# Patient Record
Sex: Male | Born: 1970 | Race: Asian | Hispanic: No | Marital: Married | Smoking: Current every day smoker
Health system: Southern US, Community
[De-identification: ages and names within clinical notes are randomized; demographics above are authoritative.]

---

## 2005-03-07 ENCOUNTER — Emergency Department (HOSPITAL_COMMUNITY): Admission: EM | Admit: 2005-03-07 | Discharge: 2005-03-07 | Payer: Self-pay | Admitting: Emergency Medicine
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
## 2014-01-29 ENCOUNTER — Other Ambulatory Visit: Payer: Self-pay | Admitting: Orthopaedic Surgery

## 2014-01-29 DIAGNOSIS — M545 Low back pain, unspecified: Secondary | ICD-10-CM

## 2014-02-05 ENCOUNTER — Ambulatory Visit
Admission: RE | Admit: 2014-02-05 | Discharge: 2014-02-05 | Disposition: A | Discharge status: Home / Self Care | Payer: Medicaid Other | Source: Ambulatory Visit | Attending: Orthopaedic Surgery | Admitting: Orthopaedic Surgery

## 2014-02-05 DIAGNOSIS — M545 Low back pain, unspecified: Secondary | ICD-10-CM

## 2024-03-04 ENCOUNTER — Ambulatory Visit: Admission: EM | Admit: 2024-03-04 | Discharge: 2024-03-04 | Disposition: A | Discharge status: Home / Self Care

## 2024-03-04 ENCOUNTER — Encounter (HOSPITAL_COMMUNITY): Payer: Self-pay | Admitting: *Deleted

## 2024-03-04 ENCOUNTER — Other Ambulatory Visit: Payer: Self-pay

## 2024-03-04 ENCOUNTER — Emergency Department (HOSPITAL_COMMUNITY)
Admission: EM | Admit: 2024-03-04 | Discharge: 2024-03-04 | Disposition: A | Discharge status: Home / Self Care | Source: Other Acute Inpatient Hospital | Attending: Emergency Medicine | Admitting: Emergency Medicine

## 2024-03-04 ENCOUNTER — Emergency Department (HOSPITAL_COMMUNITY)

## 2024-03-04 ENCOUNTER — Encounter: Payer: Self-pay | Admitting: *Deleted

## 2024-03-04 DIAGNOSIS — R221 Localized swelling, mass and lump, neck: Secondary | ICD-10-CM | POA: Diagnosis not present

## 2024-03-04 LAB — CBC WITH DIFFERENTIAL/PLATELET
Abs Immature Granulocytes: 0.02 K/uL (ref 0.00–0.07)
Basophils Absolute: 0 K/uL (ref 0.0–0.1)
Basophils Relative: 0 %
Eosinophils Absolute: 0.1 K/uL (ref 0.0–0.5)
Eosinophils Relative: 2 %
HCT: 42.4 % (ref 39.0–52.0)
Hemoglobin: 13.1 g/dL (ref 13.0–17.0)
Immature Granulocytes: 0 %
Lymphocytes Relative: 36 %
Lymphs Abs: 2.1 K/uL (ref 0.7–4.0)
MCH: 24.7 pg — ABNORMAL LOW (ref 26.0–34.0)
MCHC: 30.9 g/dL (ref 30.0–36.0)
MCV: 79.8 fL — ABNORMAL LOW (ref 80.0–100.0)
Monocytes Absolute: 0.3 K/uL (ref 0.1–1.0)
Monocytes Relative: 5 %
Neutro Abs: 3.3 K/uL (ref 1.7–7.7)
Neutrophils Relative %: 57 %
Platelets: 376 K/uL (ref 150–400)
RBC: 5.31 MIL/uL (ref 4.22–5.81)
RDW: 13.7 % (ref 11.5–15.5)
WBC: 5.9 K/uL (ref 4.0–10.5)
nRBC: 0 % (ref 0.0–0.2)

## 2024-03-04 LAB — I-STAT CHEM 8, ED
BUN: 13 mg/dL (ref 6–20)
Calcium, Ion: 1.1 mmol/L — ABNORMAL LOW (ref 1.15–1.40)
Chloride: 105 mmol/L (ref 98–111)
Creatinine, Ser: 0.7 mg/dL (ref 0.61–1.24)
Glucose, Bld: 91 mg/dL (ref 70–99)
HCT: 41 % (ref 39.0–52.0)
Hemoglobin: 13.9 g/dL (ref 13.0–17.0)
Potassium: 4.2 mmol/L (ref 3.5–5.1)
Sodium: 139 mmol/L (ref 135–145)
TCO2: 26 mmol/L (ref 22–32)

## 2024-03-04 LAB — BASIC METABOLIC PANEL WITH GFR
Anion gap: 10 (ref 5–15)
BUN: 11 mg/dL (ref 6–20)
CO2: 23 mmol/L (ref 22–32)
Calcium: 9.1 mg/dL (ref 8.9–10.3)
Chloride: 103 mmol/L (ref 98–111)
Creatinine, Ser: 0.62 mg/dL (ref 0.61–1.24)
GFR, Estimated: >60 mL/min (ref >60)
Glucose, Bld: 90 mg/dL (ref 70–99)
Potassium: 4.1 mmol/L (ref 3.5–5.1)
Sodium: 136 mmol/L (ref 135–145)

## 2024-03-04 MED ORDER — IOHEXOL 350 MG/ML SOLN
75.0000 mL | Freq: Once | INTRAVENOUS | Status: AC | PRN
  Administered 2024-03-04: 75 mL via INTRAVENOUS

## 2024-03-04 MED ORDER — AMOXICILLIN-POT CLAVULANATE 875-125 MG PO TABS
1.0000 | ORAL_TABLET | Freq: Once | ORAL | Status: AC
  Administered 2024-03-04: 1 via ORAL
  Filled 2024-03-04: qty 1

## 2024-03-04 MED ORDER — AMOXICILLIN-POT CLAVULANATE 875-125 MG PO TABS: 1.0000 | ORAL_TABLET | Freq: Two times a day (BID) | ORAL | 0 refills | Status: AC

## 2024-03-04 NOTE — Discharge Instructions (Signed)
 Please go to the emergency department for assessment of your neck mass.

## 2024-03-04 NOTE — ED Triage Notes (Signed)
 Pt reports he has had a knot on right side of his neck x 2 weeks. It started after a cough but that his resolved. Denies fever. States tender to touch.

## 2024-03-04 NOTE — ED Notes (Signed)
 Patient is being discharged from the Urgent Care and sent to the Emergency Department via personal vehicle. Per Asberry Searle, PA-C, patient is in need of higher level of care due to needing a higher level care nto available at Urgent Care. Patient is aware and verbalizes understanding of plan of care.  Vitals:   03/04/24 0822  BP: 130/87  Pulse: 71  Resp: 20  Temp: 98.3 F (36.8 C)  SpO2: 98%

## 2024-03-04 NOTE — Discharge Instructions (Signed)
 As discussed, you will need to have follow-up with the ENT provider (Dr. Roark) early next week.  Please call their office first thing Monday morning and tell them that you were referred from the [facility] emergency department.  Seek emergency care if experiencing any new or worsening symptoms such as difficulty breathing or swallowing.

## 2024-03-04 NOTE — ED Provider Notes (Signed)
 [place] EMERGENCY DEPARTMENT AT [facility] Provider Note   CSN: [id number] Arrival date & time: 03/04/24  9077     Patient presents with: Mass   Gregory Schmitt is a 53 y.o. male with non-contributory PMHx who presents to ED concerned for a mass in right upper neck -  just underneath jaw line. Patient endorsing having a viral URI last week with coughing, and neck mass started growing in size since then. Family member finally convinced patient to seek medical care today and they went to UC who referred patient to ED for further imaging.    HPI     Prior to Admission medications   Medication Sig Start Date End Date Taking? Authorizing Provider  amoxicillin -clavulanate (AUGMENTIN ) 875-125 MG tablet Take 1 tablet by mouth every 12 (twelve) hours for 7 days. 03/04/24 03/11/24 Yes Hoy Nidia FALCON, PA-C    Allergies: Patient has no known allergies.    Review of Systems  HENT:         Neck mass    Updated Vital Signs BP 134/83 (BP Location: Right Arm)   Pulse 66   Temp 98.4 F (36.9 C)   Resp 17   SpO2 100%   Physical Exam Vitals and nursing note reviewed.  Constitutional:      General: He is not in acute distress.    Appearance: He is not ill-appearing or toxic-appearing.  HENT:     Head: Normocephalic and atraumatic.     Mouth/Throat:     Comments: Hardened mass on right upper neck just below angle of mandible. Not pulsatile. Bedside US  without concern for abscess or pulsatile mass. Eyes:     General: No scleral icterus.       Right eye: No discharge.        Left eye: No discharge.     Conjunctiva/sclera: Conjunctivae normal.  Cardiovascular:     Rate and Rhythm: Normal rate.  Pulmonary:     Effort: Pulmonary effort is normal.  Abdominal:     General: Abdomen is flat.  Skin:    General: Skin is warm and dry.  Neurological:     General: No focal deficit present.     Mental Status: He is alert. Mental status is at baseline.  Psychiatric:         Mood and Affect: Mood normal.        Behavior: Behavior normal.     (all labs ordered are listed, but only abnormal results are displayed) Labs Reviewed  CBC WITH DIFFERENTIAL/PLATELET - Abnormal; Notable for the following components:      Result Value   MCV 79.8 (*)    MCH 24.7 (*)    All other components within normal limits  I-STAT CHEM 8, ED - Abnormal; Notable for the following components:   Calcium, Ion 1.10 (*)    All other components within normal limits  BASIC METABOLIC PANEL WITH GFR    EKG: None  Radiology: CT Soft Tissue Neck W Contrast Result Date: 03/04/2024 EXAM: CT NECK WITH CONTRAST 03/04/2024 12:07:01 PM TECHNIQUE: CT of the neck was performed with the administration of intravenous contrast. Multiplanar reformatted images are provided for review. Automated exposure control, iterative reconstruction, and/or weight based adjustment of the mA/kV was utilized to reduce the radiation dose to as low as reasonably achievable. COMPARISON: None available. CLINICAL HISTORY: Soft tissue infection suspected, neck, xray done. Patient c/o swelling to the right side of his neck onset 2 weeks ago after coughing. Denies SOB or  any difficulty swallowing, denies SOB. States is tender to touch. FINDINGS: AERODIGESTIVE TRACT: There is abnormal enhancement and fusiform thickening of the right true vocal cord, which is demonstrated on image 92 of series 3 and also image 70 of coronal series 6. The abnormal thickening and enhancement is fine to the regulitis. SALIVARY GLANDS: The parotid and submandibular glands are unremarkable. THYROID: Unremarkable. LYMPH NODES: There are irregular, centrally hypodense right level 2A and 3 lymph nodes seen on images 69 and 72 of series 3 with the larger node measuring 2.2 x 1.7 x 1.6 cm. The level 3 node measures approximately 11 x 11 x 16 mm. Several additional smaller right cervical lymph nodes are present. SOFT TISSUES: No mass or fluid collection. BRAIN,  ORBITS, SINUSES AND MASTOIDS: There is mucosal disease present within the frontal, ethmoid and maxillary sinuses. LUNGS AND MEDIASTINUM: No acute abnormality. BONES: No focal bone abnormality. IMPRESSION: 1. Abnormal enhancement and fusiform thickening of the right true vocal cord. Referral to ENT is recommended. 2. Irregular, centrally hypodense right level 2A and 3 lymph nodes, with the largest measuring 2.2 x 1.7 x 1.6 cm. The lymph nodes could be suppurative or centrally necrotic. Metastatic disease is a diagnosis of exclusion. 3. Mucosal disease within the frontal, ethmoid, and maxillary sinuses. Electronically signed by: Evalene Coho MD 03/04/2024 12:28 PM EDT RP Workstation: [id number]     Procedures   Medications Ordered in the ED  amoxicillin -clavulanate (AUGMENTIN ) 875-125 MG per tablet 1 tablet (has no administration in time range)  iohexol  (OMNIPAQUE ) 350 MG/ML injection 75 mL (75 mLs Intravenous Contrast Given 03/04/24 1157)                                    Medical Decision Making Amount and/or Complexity of Data Reviewed Labs: ordered. Radiology: ordered.  Risk Prescription drug management.   This patient presents to the ED for concern of neck mass, this involves an extensive number of treatment options, and is a complaint that carries with it a high risk of complications and morbidity.  The differential diagnosis includes cancer, lymph node, deep space infection, etc.   Co morbidities that complicate the patient evaluation  none   Additional history obtained:  No PCP listed in chart    Problem List / ED Course / Critical interventions / Medication management  Patient presents to ED concern for mass on right side of neck that has been developing over the past 1 to 2 weeks.  Patient believes that mass started because he was coughing last week.  Physical exam with hardened mass just below the angle of the right mandible.  Rest of physical exam reassuring.   Patient afebrile with stable vitals. I Ordered, and personally interpreted labs.  CBC without leukocytosis or anemia.  BMP reassuring. I ordered imaging studies including CT soft tissue neck. I independently visualized and interpreted imaging which showed abnormal enhancement in falciform thickening of the right true vocal cord.  Irregular hypodense lymph nodes concerning for suppurative/necrotic/metastatic disease. I agree with the radiologist interpretation I requested consultation with the ENT provider on-call Dr. Roark,  and discussed lab and imaging findings as well as pertinent plan - they recommend: quick outpatient follow up with their office early next week. Also recommending starting Augmentin  for possible infectious cause.  Shared all results with patient and family member at bedside.  Answered all questions.  Patient agrees to call the ENT office first  thing on Monday morning for a quick follow-up appointment.  Wife at bedside concerned because patient will have to miss work.  I have given patient work excuse. Patient tolerated first dose of Augmentin  well in ED - sent rest of course to pharmacy. Patient still tolerating food and breathing well. I have reviewed the patients home medicines and have made adjustments as needed The patient has been appropriately medically screened and/or stabilized in the ED. I have low suspicion for any other emergent medical condition which would require further screening, evaluation or treatment in the ED or require inpatient management. At time of discharge the patient is hemodynamically stable and in no acute distress. I have discussed work-up results and diagnosis with patient and answered all questions. Patient is agreeable with discharge plan. We discussed strict return precautions for returning to the emergency department and they verbalized understanding.     Social Determinants of Health:  none      Final diagnoses:  Neck mass    ED Discharge  Orders          Ordered    amoxicillin -clavulanate (AUGMENTIN ) 875-125 MG tablet  Every 12 hours        03/04/24 1410               Hoy Nidia FALCON, PA-C 03/04/24 1413    Patsey Lot, MD 03/08/24 [phone]

## 2024-03-04 NOTE — ED Provider Notes (Signed)
 [facility]    CSN: [id number] Arrival date & time: 03/04/24  0803      History   Chief Complaint Chief Complaint  Patient presents with   Mass    HPI Gregory Schmitt is a 53 y.o. male.  2 weeks ago he had a bad cough Reports he was coughing so hard a bulge appeared on the right neck It's now been worsening the last several days or so Tight feeling when he moves the neck Having headaches on and off.  Maybe some discomfort swallowing but he can tolerate fluids and secretions Denies fever, chills, abd pain, NVD, night sweats, weight loss Not short of breath, no chest pain or palpitations   History reviewed. No pertinent past medical history.  There are no active problems to display for this patient.   History reviewed. No pertinent surgical history.     Home Medications    Prior to Admission medications   Not on File    Family History History reviewed. No pertinent family history.  Social History Social History   Tobacco Use   Smoking status: Every Day    Types: Cigarettes   Smokeless tobacco: Never  Vaping Use   Vaping status: Former  Substance Use Topics   Alcohol use: Not Currently     Allergies   Patient has no known allergies.   Review of Systems Review of Systems  As per HPI  Physical Exam Triage Vital Signs ED Triage Vitals  Encounter Vitals Group     BP 03/04/24 0822 130/87     Girls Systolic BP Percentile --      Girls Diastolic BP Percentile --      Boys Systolic BP Percentile --      Boys Diastolic BP Percentile --      Pulse Rate 03/04/24 0822 71     Resp 03/04/24 0822 20     Temp 03/04/24 0822 98.3 F (36.8 C)     Temp Source 03/04/24 0822 Oral     SpO2 03/04/24 0822 98 %     Weight --      Height --      Head Circumference --      Peak Flow --      Pain Score 03/04/24 0829 0     Pain Loc --      Pain Education --      Exclude from Growth Chart --    No data found.  Updated Vital Signs BP  130/87 (BP Location: Left Arm)   Pulse 71   Temp 98.3 F (36.8 C) (Oral)   Resp 20   SpO2 98%    Physical Exam Vitals and nursing note reviewed.  Constitutional:      General: He is not in acute distress.    Appearance: Normal appearance.  HENT:     Head: Mass present.     Mouth/Throat:     Pharynx: Oropharynx is clear.  Neck:     Thyroid: No thyroid mass.     Vascular: Normal carotid pulses. No carotid bruit or JVD.     Trachea: Trachea normal.      Comments: Firm non mobile mass of the right neck. Tender to palpation  Cardiovascular:     Rate and Rhythm: Normal rate and regular rhythm.     Pulses: Normal pulses.          Carotid pulses are 2+ on the right side and 2+ on the left side.    Heart sounds:  Normal heart sounds.  Pulmonary:     Effort: Pulmonary effort is normal.     Breath sounds: Normal breath sounds.  Musculoskeletal:     Cervical back: No spinous process tenderness. Normal range of motion.  Neurological:     Mental Status: He is alert and oriented to person, place, and time.     UC Treatments / Results  Labs (all labs ordered are listed, but only abnormal results are displayed) Labs Reviewed - No data to display  EKG  Radiology No results found.  Procedures Procedures   Medications Ordered in UC Medications - No data to display  Initial Impression / Assessment and Plan / UC Course  I have reviewed the triage vital signs and the nursing notes.  Pertinent labs & imaging results that were available during my care of the patient were reviewed by me and considered in my medical decision making (see chart for details).  Firm mass of the right neck just inferior to angle of jaw. The SCM feels rigid in this area. Tender to palpation.  Does not seem to be lymph node.  Discussion with patient.  At this time I recommend patient have advanced imaging performed to rule out deep neck infection, non-benign mass, or carotid abnormality. He is sent to the  emergency department for imaging not available in urgent care setting.   Final Clinical Impressions(s) / UC Diagnoses   Final diagnoses:  Neck mass     Discharge Instructions      Please go to the emergency department for assessment of your neck mass.    ED Prescriptions   None    PDMP not reviewed this encounter.   Jeryl Stabs, PA-C 03/04/24 0925

## 2024-03-04 NOTE — ED Notes (Signed)
 Pt has been missing from bed for a little bit.  Unclear if they have left.  Message left on phone

## 2024-03-04 NOTE — ED Triage Notes (Signed)
 Patient c/o swelling to the right side of his neck onset 2 weeks ago after coughing . Denies sob or any difficulty swallowing, denies sob. States is tender to touch.

## 2024-03-07 ENCOUNTER — Encounter (INDEPENDENT_AMBULATORY_CARE_PROVIDER_SITE_OTHER): Payer: Self-pay | Admitting: Otolaryngology

## 2024-03-07 ENCOUNTER — Ambulatory Visit (INDEPENDENT_AMBULATORY_CARE_PROVIDER_SITE_OTHER): Admitting: Otolaryngology

## 2024-03-07 VITALS — BP 116/77 | HR 83 | Ht 67.0 in | Wt 130.0 lb

## 2024-03-07 DIAGNOSIS — F17200 Nicotine dependence, unspecified, uncomplicated: Secondary | ICD-10-CM

## 2024-03-07 DIAGNOSIS — F17210 Nicotine dependence, cigarettes, uncomplicated: Secondary | ICD-10-CM

## 2024-03-07 DIAGNOSIS — R221 Localized swelling, mass and lump, neck: Secondary | ICD-10-CM

## 2024-03-07 NOTE — Patient Instructions (Addendum)
 I have ordered a biopsy for you to complete prior to your next visit. Please call [facility] Scheduling at [phone] to schedule your imaging if you have not received a call within 24 hours. If you are unable to complete your imaging study prior to your next scheduled visit please call our office to let us  know.

## 2024-03-07 NOTE — Progress Notes (Signed)
 Otolaryngology Clinic Note HPI:  Gregory Schmitt is a 53 y.o. male kindly referred for ED follow up for neck mass  Initial Visit (02/2024): Noted right neck mass after he started coughing about 2 weeks ago. He was given augmentin , feels like it may have gone down slightly. He can still feel it some. Some pain with neck turning but mild, some right ear tingling. No fevers. Does have a sore throat, but only going on for 2 weeks Patient otherwise denies: - dysphagia, unintentional weight loss - changes in voice, shortness of breath, hemoptysis  ENT Surgery: no Personal or FHx of bleeding dz or anesthesia difficulty: no  GLP-1: no AP/AC: no  Tobacco: 0.33 packs/year (long time). Alcohol: 12 packs/weekend. Occupation: Fish farm manager  PMHx: Denies  Independent Review of Additional Tests or Records:  Nidia Mays (03/04/2024): noted right neck mass, in setting of recent viral URI; CT was done, which showed LN concerning for metastatic LN; Dx: Neck mass, likely vocal fold mass; Rx: ref to ENT, given Augmentin  CT Neck 03/04/2024 independently interpreted: multiple right neck lymph nodes with likely some internal necrosis; right VF appears to enhance more than left. Parotids b/l mildly enlarged but no nodules; modest MPT frontal/ethmoid/max; do not note obvious tongue base or tonsillar masses PMH/Meds/All/SocHx/FamHx/ROS:  History reviewed. No pertinent past medical history.   History reviewed. No pertinent surgical history.  History reviewed. No pertinent family history.   Social Connections: Not on file      Current Outpatient Medications:    amoxicillin -clavulanate (AUGMENTIN ) 875-125 MG tablet, Take 1 tablet by mouth every 12 (twelve) hours for 7 days., Disp: 14 tablet, Rfl: 0   Physical Exam:   BP 116/77 (BP Location: Left Arm, Patient Position: Sitting, Cuff Size: Normal)   Pulse 83   Ht 5' 7 (1.702 m)   Wt 130 lb (59 kg)   SpO2 98%   BMI 20.36 kg/m   Salient findings:  CN  II-XII intact  Bilateral EAC clear and TM intact with well pneumatized middle ear spaces Anterior rhinoscopy: Septum intact; bilateral inferior turbinates without significant hypertrophy No lesions of oral cavity/oropharynx; dentition fair; tonsils soft, no palpable GT sulcus masses Firm right neck level 1b/2 mass, palpable; no other palpable LAD No respiratory distress or stridor; TFL was indicated to better evaluate the proximal airway, given the patient's history and exam findings, and is detailed below.   Seprately Identifiable Procedures:  Prior to initiating any procedures, risks/benefits/alternatives were explained to the patient and verbal consent obtained. Procedure Note Pre-procedure diagnosis:  Neck mass, investigate primary lesion Post-procedure diagnosis: Same Procedure: Transnasal Fiberoptic Laryngoscopy, CPT 31575 - Mod 25 Indication: see above Complications: None apparent EBL: 0 mL  The procedure was undertaken to further evaluate the patient's complaint above, with mirror exam inadequate for appropriate examination due to gag reflex and poor patient tolerance  Procedure:  Patient was identified as correct patient. Verbal consent was obtained. The nose was sprayed with oxymetazoline and 4% lidocaine. The The flexible laryngoscope was passed through the nose to view the nasal cavity, pharynx (oropharynx, hypopharynx) and larynx.  The larynx was examined at rest and during multiple phonatory tasks. Documentation was obtained and reviewed with patient. The scope was removed. The patient tolerated the procedure well.  Findings: The nasal cavity and nasopharynx did not reveal any masses or lesions, mucosa appeared to be without obvious lesions. The tongue base, pharyngeal walls, piriform sinuses, vallecula, epiglottis and postcricoid region are normal in appearance - query slight asymmetry right pyriform and  left pyriform; modest retained secretions; no masses on either vocal folds  or tongue base. The visualized portion of the subglottis and proximal trachea is widely patent. The vocal folds are mobile bilaterally. There are no lesions on the free edge of the vocal folds nor elsewhere in the larynx worrisome for malignancy.    Electronically signed by: Eldora KATHEE Blanch, MD 03/07/2024 11:19 AM   Impression & Plans:  Gregory Schmitt is a 53 y.o. male with:  1. Neck mass   2. Tobacco use disorder    Right neck mass in setting of tobacco and alcohol use. DDX is wide but given history, malignancy highest on differential.  Will order STAT Bx of neck; depending on results, consider DL/Bx  Given the patient's tobacco use, I also discussed cessation and options for cessation, including counseling. Counseled patient on the dangers of tobacco use, advised patient to stop smoking, and reviewed strategies to maximize success. Total time spent with this was 4 minutes.    See below regarding exact medications prescribed this encounter including dosages and route: No orders of the defined types were placed in this encounter.     Thank you for allowing me the opportunity to care for your patient. Please do not hesitate to contact me should you have any other questions.  Sincerely, Eldora Blanch, MD Otolaryngologist (ENT), [facility] Phone: [phone] Fax: [phone]  03/07/2024, 11:18 AM   MDM:  Level 4 - [id number] Complexity/Problems addressed: mod - new problem, unknown diagnosis and prognosis needing further testing Data complexity: high - independent interpretation of CT; review of notes, labs, ordering test - Morbidity: low currently  - Prescription Drug prescribed or managed: no

## 2024-03-09 ENCOUNTER — Encounter (HOSPITAL_COMMUNITY): Payer: Self-pay

## 2024-03-09 NOTE — Progress Notes (Signed)
 wonda Sieving, MD  Kaylise Blakeley PROCEDURE / BIOPSY REVIEW Date: 03/08/24  Requested Biopsy site: R neck LAN Reason for request: r/o met Imaging review: Best seen on CT 9/6 3:65-76  Decision: Approved Imaging modality to perform: Ultrasound Schedule with: No sedation / Local anesthetic Schedule for: Any VIR  Additional comments:   Please contact me with questions, concerns, or if issue pertaining to this request arise.  Dayne Sieving Faes, MD [facility]       Previous Messages    ----- Message ----- From: Lilliauna Van Sent: 03/07/2024  11:19 AM EDT To: Nashalie Sallis; Ir Procedure Requests Subject: US  Core biopsy ( lymph nodes)                  Procedure : US  core biopsy ( lymph nodes)  Reason : right neck mass, concern for carcinoma; request core biopsy Dx: Neck mass [R22.1 (ICD-10-CM)]  Ordering Comments  right neck mass, concern for carcinoma; request core biopsy    History : Ct soft tissue neck  Provider : Tobie Eldora NOVAK, MD  Contact : [phone]

## 2024-03-21 ENCOUNTER — Ambulatory Visit (INDEPENDENT_AMBULATORY_CARE_PROVIDER_SITE_OTHER): Admitting: Otolaryngology

## 2024-03-31 ENCOUNTER — Ambulatory Visit (HOSPITAL_COMMUNITY)
Admission: RE | Admit: 2024-03-31 | Discharge: 2024-03-31 | Disposition: A | Discharge status: Home / Self Care | Source: Ambulatory Visit | Attending: Otolaryngology | Admitting: Otolaryngology

## 2024-03-31 ENCOUNTER — Other Ambulatory Visit: Payer: Self-pay

## 2024-03-31 DIAGNOSIS — R221 Localized swelling, mass and lump, neck: Secondary | ICD-10-CM | POA: Insufficient documentation

## 2024-03-31 DIAGNOSIS — R590 Localized enlarged lymph nodes: Secondary | ICD-10-CM | POA: Diagnosis present

## 2024-03-31 MED ORDER — LIDOCAINE HCL (PF) 1 % IJ SOLN: 10.0000 mL | Freq: Once | INTRAMUSCULAR | Status: DC

## 2024-03-31 MED ORDER — LIDOCAINE HCL 1 % IJ SOLN
INTRAMUSCULAR | Status: AC
  Filled 2024-03-31: qty 20

## 2024-03-31 NOTE — Procedures (Signed)
 Vascular [facility] Inter[facility] Radiology Procedure Note  Patient: Gregory Schmitt DOB: October 03, 1970 Medical Record Number: [id number] Note Date/Time: 03/31/24 1:01 PM   Performing Physician: Thom Hall, MD Assistant(s): None  Diagnosis: enlarged R cervical LNs. No DX   Procedure: RIGHT CERVICAL LYMPH NODE BIOPSY  Anesthesia: Conscious Sedation Complications: None Estimated Blood Loss: Minimal Specimens: Sent for Pathology  Findings:  Successful Ultrasound-guided biopsy of enlarged R cervical LNs. A total of 3 samples were obtained. Hemostasis of the tract was achieved using Manual Pressure.  Plan: Bed rest for 1 hours.  See detailed procedure note with images in PACS. The patient tolerated the procedure well without incident or complication and was returned to Recovery in stable condition.    Thom Hall, MD [facility]   Pager. [phone] Clinic. [phone]

## 2024-04-06 ENCOUNTER — Telehealth (INDEPENDENT_AMBULATORY_CARE_PROVIDER_SITE_OTHER): Payer: Self-pay

## 2024-04-06 ENCOUNTER — Telehealth (INDEPENDENT_AMBULATORY_CARE_PROVIDER_SITE_OTHER): Payer: Self-pay | Admitting: Otolaryngology

## 2024-04-06 LAB — SURGICAL PATHOLOGY

## 2024-04-06 NOTE — Telephone Encounter (Signed)
 LVM for patient to call to schedule an appt for next week.

## 2024-04-06 NOTE — Telephone Encounter (Signed)
-----   Message from Eldora KATHEE Blanch sent at 04/05/2024  3:28 PM EDT ----- Regarding: RE: Pathology update Thanks. Nef would you mind adding this patient on for next week? Thanks KP ----- Message ----- From: Hughes Simmonds, MD Sent: 04/05/2024   2:28 PM EDT To: Pepper Dutton, MD; Eldora KATHEE Blanch, MD Subject: RE: Pathology update                           Acknowledged. Added the Pt's ENT to this conversation.   Thanks, ONA, MD ----- Message ----- From: Dutton Pepper, MD Sent: 04/05/2024  11:37 AM EDT To: Simmonds Hughes, MD Subject: Pathology update                               Hello Dr. 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 There is squamous differentiation but p40 is only partial positive. 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 If we can find any squamous cell carcinoma elsewhere in the body that will support the diagnosis of squamous cell carcinoma. So imaging study is the the key. You will se the report soon.  Thanks,  Haiyan

## 2024-04-06 NOTE — Telephone Encounter (Signed)
 The patient's wife called back, she is unable to schedule the requested appointment until she speaks with her husband when he gets home from work.  I expressed that Dr Tobie is requesting that he come into the office next week.

## 2024-04-11 ENCOUNTER — Encounter (INDEPENDENT_AMBULATORY_CARE_PROVIDER_SITE_OTHER): Payer: Self-pay | Admitting: Otolaryngology

## 2024-04-11 ENCOUNTER — Ambulatory Visit (INDEPENDENT_AMBULATORY_CARE_PROVIDER_SITE_OTHER): Admitting: Otolaryngology

## 2024-04-11 VITALS — BP 125/76 | HR 73 | Ht 67.0 in | Wt 130.0 lb

## 2024-04-11 DIAGNOSIS — C7989 Secondary malignant neoplasm of other specified sites: Secondary | ICD-10-CM

## 2024-04-11 DIAGNOSIS — F17210 Nicotine dependence, cigarettes, uncomplicated: Secondary | ICD-10-CM | POA: Diagnosis not present

## 2024-04-11 DIAGNOSIS — C4492 Squamous cell carcinoma of skin, unspecified: Secondary | ICD-10-CM

## 2024-04-11 DIAGNOSIS — F17200 Nicotine dependence, unspecified, uncomplicated: Secondary | ICD-10-CM

## 2024-04-11 MED ORDER — CLINDAMYCIN HCL 300 MG PO CAPS: 300.0000 mg | ORAL_CAPSULE | Freq: Three times a day (TID) | ORAL | 0 refills | Status: AC

## 2024-04-11 NOTE — Progress Notes (Signed)
 Otolaryngology Clinic Note HPI:  Gregory Schmitt is a 53 y.o. male kindly referred for ED follow up for neck mass  Initial Visit (02/2024): Noted right neck mass after he started coughing about 2 weeks ago. He was given augmentin , feels like it may have gone down slightly. He can still feel it some. Some pain with neck turning but mild, some right ear tingling. No fevers. Does have a sore throat, but only going on for 2 weeks Patient otherwise denies: - dysphagia, unintentional weight loss - changes in voice, shortness of breath, hemoptysis  --------------------------------------------------------- 04/11/2024 Some delay due to his schedule, as he could not have the biopsies done for a month. We discussed his path. He reports he has some tenderness in his neck and redness afterwards making it harder to move his neck. No PET yet.  He reports he quit drinking but still continues to smoke.   ENT Surgery: no Personal or FHx of bleeding dz or anesthesia difficulty: no  GLP-1: no AP/AC: no  Tobacco: 0.33 packs/year (long time). Alcohol: 12 packs/weekend. Occupation: Fish farm manager  PMHx: Denies  Independent Review of Additional Tests or Records:  Nidia Mays (03/04/2024): noted right neck mass, in setting of recent viral URI; CT was done, which showed LN concerning for metastatic LN; Dx: Neck mass, likely vocal fold mass; Rx: ref to ENT, given Augmentin  CT Neck 03/04/2024 independently interpreted: multiple right neck lymph nodes with likely some internal necrosis; right VF appears to enhance more than left. Parotids b/l mildly enlarged but no nodules; modest MPT frontal/ethmoid/max; do not note obvious tongue base or tonsillar masses Path 03/31/2024: p16- mod to poorly differentiated carcinoma.  PMH/Meds/All/SocHx/FamHx/ROS:  History reviewed. No pertinent past medical history.   History reviewed. No pertinent surgical history.  History reviewed. No pertinent family history.   Social  Connections: Not on file      Current Outpatient Medications:    clindamycin (CLEOCIN) 300 MG capsule, Take 1 capsule (300 mg total) by mouth 3 (three) times daily for 10 days., Disp: 30 capsule, Rfl: 0   Physical Exam:   BP 125/76 (BP Location: Left Arm, Patient Position: Sitting, Cuff Size: Normal)   Pulse 73   Ht 5' 7 (1.702 m)   Wt 130 lb (59 kg)   SpO2 99%   BMI 20.36 kg/m   Salient findings:  CN II-XII intact  Bilateral EAC clear and TM intact with well pneumatized middle ear spaces No lesions of oral cavity/oropharynx; dentition fair; tonsils soft, no palpable GT sulcus masses Firm right neck level 2 mass, palpable; currently his neck movement seems more restricted as he does have some overlying cellulitic change.  No respiratory distress or stridor  Seprately Identifiable Procedures:  Prior to initiating any procedures, risks/benefits/alternatives were explained to the patient and verbal consent obtained. Procedure Note (Prior, not today) Pre-procedure diagnosis:  Neck mass, investigate primary lesion Post-procedure diagnosis: Same Procedure: Transnasal Fiberoptic Laryngoscopy, CPT 31575 - Mod 25 Indication: see above Complications: None apparent EBL: 0 mL  The procedure was undertaken to further evaluate the patient's complaint above, with mirror exam inadequate for appropriate examination due to gag reflex and poor patient tolerance  Procedure:  Patient was identified as correct patient. Verbal consent was obtained. The nose was sprayed with oxymetazoline and 4% lidocaine. The The flexible laryngoscope was passed through the nose to view the nasal cavity, pharynx (oropharynx, hypopharynx) and larynx.  The larynx was examined at rest and during multiple phonatory tasks. Documentation was obtained and reviewed with patient.  The scope was removed. The patient tolerated the procedure well.  Findings: The nasal cavity and nasopharynx did not reveal any masses or lesions,  mucosa appeared to be without obvious lesions. The tongue base, pharyngeal walls, piriform sinuses, vallecula, epiglottis and postcricoid region are normal in appearance - modest prominence tongue base; query slight asymmetry right pyriform and left pyriform; modest retained secretions; no masses on either vocal folds or tongue base. The visualized portion of the subglottis and proximal trachea is widely patent. The vocal folds are mobile bilaterally. There are no lesions on the free edge of the vocal folds nor elsewhere I can appreciate.  Electronically signed by: Eldora KATHEE Blanch, MD 04/11/2024 10:13 AM   Impression & Plans:  Gregory Schmitt is a 53 y.o. male with:  1. 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 Tobacco use disorder    P16- SCCa unknown primary in setting of tobacco and alcohol use.  Some delay In biopsies as he could not get them done for a month. Now with some cellulitis so will treat with antibiotics for this  Query if he has ENE, but no obvious oropharynx target on last exam, will refer to [facility] urgently for DL/Bx and possible surgical candidacy consideration to avoid any delay in his care as he continues to smoke which I again advised cessation. Especially as he is apprehensive about chemotherapy.   See below regarding exact medications prescribed this encounter including dosages and route: Meds ordered this encounter  Medications   clindamycin (CLEOCIN) 300 MG capsule    Sig: Take 1 capsule (300 mg total) by mouth 3 (three) times daily for 10 days.    Dispense:  30 capsule    Refill:  0      Thank you for allowing me the opportunity to care for your patient. Please do not hesitate to contact me should you have any other questions.  Sincerely, Eldora Blanch [facility] Phone: [phone] Fax: [phone]  04/11/2024, 10:13 AM   I have personally spent 40 minutes involved in face-to-face and non-face-to-face  activities for this patient on the day of the visit.  Professional time spent excludes any procedures performed but includes the following activities, in addition to those noted in the documentation: preparing to see the patient (review of outside documentation and results), performing a medically appropriate examination, counseling, ordering medications (clindamycin), documenting in the electronic health record

## 2024-04-11 NOTE — Patient Instructions (Addendum)
 PET Scan: have ordered an imaging study for you to complete prior to your next visit. Please call [facility] Scheduling at [phone] to schedule your imaging if you have not received a call within 24 hours. If you are unable to complete your imaging study prior to your next scheduled visit please call our office to let us  know.   [facility] ENT: [phone]

## 2024-04-12 ENCOUNTER — Encounter (INDEPENDENT_AMBULATORY_CARE_PROVIDER_SITE_OTHER): Payer: Self-pay

## 2024-04-17 ENCOUNTER — Telehealth (INDEPENDENT_AMBULATORY_CARE_PROVIDER_SITE_OTHER): Payer: Self-pay

## 2024-04-17 MED ORDER — OXYCODONE HCL 5 MG PO CAPS: 5.0000 mg | ORAL_CAPSULE | ORAL | 0 refills | Status: AC | PRN

## 2024-04-17 NOTE — Telephone Encounter (Signed)
 Pt wife called in asking if Tobie could prescribe him pain meds he has become swollen and is in a lot of pain he hasn't slept for a couple days. Its not draining.

## 2024-04-17 NOTE — Addendum Note (Signed)
 Addended by: Damarri Rampy on: 04/17/2024 08:56 AM   Modules accepted: Orders

## 2024-04-25 ENCOUNTER — Encounter (HOSPITAL_COMMUNITY): Payer: Self-pay

## 2024-04-25 NOTE — Progress Notes (Signed)
 Hematology and Oncology Initial Visit  Patient: Gregory Schmitt Date: 04/25/2024 MRN: [id number]  Dear Dr. Lauralee  Thank you so much for your referral of your patient, Gregory Schmitt, whom we had the pleasure of seeing in consultation today regarding HNSCC of unknown primary. While their history is quite familiar to you, please allow us  to summarize it below for our records.  No specialty comments available.  History of Present Illness: Mr. Gregory Schmitt is a 53 y.o. patient presenting for a recent diagnosis of unknown primary neck cancer p16-. He  was referred by Dr Eldora Blanch to Dr Lauralee of ENT The patient noticed a  R neck mass about 8 weeks ago. He feels some pain when moving his neck. There had been some redness of the skin on top of the mass that improved with treatment with augmentin .  No  history of face/scalp/neck cutaneous cancers.   CT done - enlarged concerning lymph nodes  He had an FNA showing  SCCa, p16-negative FFL done, showing no obvious tumors.   . Current Status: Mr Gregory Schmitt presents today for an initial visit to discuss management of his R neck cancer  He reports minimal symptoms like some pain with moving his neck.  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    Current ECOG performance status is 0-1  Prior RT: no Prior Chemo:no Prior Immunotherapy:no IED: no Scleroderma, SLE, MCTD or RA: no  Review of Systems: A complete review of systems was performed and was otherwise negative except for those things listed above in the HPI.  Current Rx ordered in  [facility][1]  Allergies[2]  Medical History[3]  Surgical History[4]  Family History[5]  Social History   Socioeconomic History  . Marital status: Married    Spouse name: Not on file  . Number of children: Not on file  . Years of education: Not on file  . Highest education level: Not on file  Occupational History  . Not on file  Tobacco Use  . Smoking status: Some Days    Types: Cigarettes  . Smokeless tobacco: Never  Substance and Sexual Activity  . Alcohol use: Not Currently  . Drug use: Never  . Sexual activity: Not on file  Other Topics Concern  . Not on file  Social History Narrative  . Not on file   Social Drivers of Health   Food Insecurity: Not on file  Transportation Needs: Not on file  Safety: Low Risk  (04/19/2024)   Safety   . How often does anyone, including family and friends, physically hurt you?: Never   . How often does anyone, including family and friends, insult or talk down to you?: Never   . How often does anyone, including family and friends, threaten you with harm?: Never   . How often does anyone, including family and friends, scream or curse at you?: Never  Living Situation: Not on file    PHYSICAL EXAM: VITALS: There were no vitals taken for this visit. GENERAL: Well developed, well nourished, in no acute distress. Accompanied by his wife.  HEENT: Head is  normocephalic and atraumatic.  Pupils are round and reactive to light.  Sclera are anicteric.  Conjunctiva without injection. Extraocular eye movements intact. OC and OP with normal mucosa and no lesions  LYMPHATIC: Large R neck mass  CARDIOVASCULAR: Regular rate and rhythm . LUNGS: Sym resp moves  ABDOMEN: Soft, nontender, nondistended. No hepatosplenomegaly. EXTREMITIES: No clubbing, cyanosis or edema. SKIN: Mild skin rash on top of the neck mass  NEUROLOGIC: Alert and oriented x 3. Gait is normal. No focal deficits PSYCH: Pleasant appropriate affect and normal thought content.     No results found for: WBC, HGB, HCT, MCV, PLT   Chemistry   No results found for: NA, K, CL, CO2, BUN, CREATININE, GLU No results found for: CALCIUM, AST, ALT, BILITOT   Pathology:   03/31/24 FNA right neck lymph node Carcinoma with squamous differentiation, moderate to poorly differentiated.  p16-negative   Imaging:  CT neck 03/04/24 Abnormal enhancement of the right true vocal cord.  Irregular centrally hypodense right neck level 2a / 3 lymph nodes, 2.2 x 1.7 cm and 1.6 x 1.1 cm.   Scope by Dr Lauralee with suspicious area in the right pyriform sinus    Assessment and Plan: Mr Gregory Schmitt is a 53 yo patient, smoker, recently diagnosed with R neck SCC p16- with possible primary in the R pyriform sinus based on Dr Lauralee direct examination  He is here today to discuss possible role of immunotherapy in the treatment of his cancer  He reports that he is worried that his tumor is growing fast He does not have a significant pain at this time There is concern that his tumor might involve his skin   Reviewed with the patient the recommendation for PET for which he is scheduled for tomorrow. This might help with identifying his primary cancer too.  Reviewed the surgical plan and recommendation by Dr Lauralee  Discussed that he should be operated as soon as possible due to the fast growing tumor  We reviewed possible role of immunotherapy before and after surgery  Discussed that we could try to give him two adm of immunotherapy if this is not going to delay surgery significantly His tumor tissue must be brought from the OSH the biopsy was done in order to test for a marker , PD-L1 that would allow us  to treat him. If we encounter any delays because of this step we will advise Dr Lauralee so surgery is not delayed.   Alternatively we can offer immunotherapy to be added to the adjuvant treatment after the surgery with benefit in decreasing the risks of the  tumor recurrence based on current clinical study [facility].    Reviewed immunotherapy - how it works, schedule, common side-effects, risks of immune-related side-effects   The patient signed consent  We will be in touch with the patient and Dr Lauralee if we can start his treatment in the next few days or not and he should move ahead with surgery as soon as possible   Jereld Casino, MD   Add. PET scan raised also the suspicion of primary in the R pyriform sinus   Patient decided to proceed with surgery as soon as possible and I agree with this plan   Jereld Casino, MD        [1] Meds Ordered in [facility]  Medication Sig Dispense Refill  . amoxicillin -pot clavulanate (AUGMENTIN ) 875-125 mg per tablet Take 1 tablet by mouth in the morning and 1 tablet in the evening.    SABRA  oxyCODONE  5 mg capsule Take 5 mg by mouth. 5 mg total     No current Epic-ordered facility-administered medications on file.  [2] No Known Allergies [3] No past medical history on file. [4] No past surgical history on file. [5] No family history on file.

## 2024-04-26 LAB — MOLECULAR PATHOLOGY

## 2024-04-28 ENCOUNTER — Ambulatory Visit (INDEPENDENT_AMBULATORY_CARE_PROVIDER_SITE_OTHER): Admitting: Otolaryngology

## 2024-05-05 NOTE — Telephone Encounter (Signed)
 I discussed results of the PET with his wife.   He met with Dr Rhea to discuss neoadjuvant therapy but decided not to pursue this.   Tumor has advanced in the meantime.   Will need a big surgery, including:  Endoscopic resection of the pyriform tumor Laryngoscopy to look at the post-cricoid area and esophagoscopy to look at the distal esophagus (hopefully our rigid instruments will reach) Right radical neck dissection, including resection of overlying skin Tissue transfer to cover the carotid  We talked about options for this last part including:  Right pectoralis (unfavorable, as the accessory nerve may need to be sacrificed, and he works holiday representative)  Left scapula or latissimus Anterolateral thigh.    He'll think about these options.   I'll see him back in clinic to discuss further.   If a reconstructive surgeon can join for this case, we'll have him see that doc as well; if not then I can do this case myself.

## 2024-05-23 NOTE — Unmapped External Note (Signed)
 PREOPERATIVE OPTIMIZATION EVALUATION Encounter Date: 05/23/24   PROCEDURE PLANNED   Gregory Schmitt ([id number]) is a 53 y.o. male scheduled for a  Procedure Information     Date/Time: 05/29/24 0645   Procedures:      MICROLARYNGOSCOPY WITH CO2 LASER (Airway) - Need Lucyann set, panendoscopy set     DISSECTION NECK MODIFIED (Right: Neck)     FLAP FREE LOWER EXTREMITY WITH OR WITHOUT BONE FLAP FREE - latissimus, ALT, scapula, forearm (left vs right) (Arm Lower)     GRAFT SPLIT THICKNESS SKIN LOWER EXTREMITY   Location: [facility] OR 22 / [facility] OR   Surgeons: Fonda Laraine Muscat, MD; Francyne Soles, MD     .  HPI: Gregory Schmitt is an ASA 3, 56.7 kg,  Body mass index is 20.68 kg/m., who presents today for medical optimization and to ensure stabilization, of the following sPMH: Squamous cell cancer of hypopharynx  .  Gregory Schmitt has a h/o Squamous cell cancer of hypopharynx  that requires surgical intervention. For full details regarding this pt's reason for surgery please review their surgeon's H&P.      ASSESSMENT AND PLAN  Anesthesia History:  - Pt denies history of PONV or other anesthesia complications   - No family history of adverse reaction to anesthesia.    - no anesthesia records for review Surgical History: Surgical History[1]  Assessment of Medical Problems: -------------------------------------------------------------------------------------------------- 1.) Surgical Complaint  -- MICROLARYNGOSCOPY WITH CO2 LASER ,DISSECTION NECK MODIFIED ,FLAP FREE LOWER EXTREMITY WITH OR WITHOUT BONE FLAP FREE - latissimus, ALT, scapula, forearm (left vs right) ,GRAFT SPLIT THICKNESS SKIN LOWER EXTREMITY : as discussed in HPI; reason for surgery -------------------------------------------------------------------------------------------------- 2.) CARDIAC: -- Exercise Tolerance: METs= 8.23, DASI=44.7, (DASI scores above 35 are less likely to need cardiac testing prior to  surgery); Typical activity level consists of doing house and yard work -- HTN: BP today 123/70 BP Readings from Last 3 Encounters:  05/23/24 123/70  05/23/24 132/69  05/23/24 132/69     Pt denies recent chest pain. Pt denies hx of CAD/CVA -------------------------------------------------------------------------------------------------- 3.) RESPIRATORY:  -- Patient is stable and does not require further pulmonary testing prior to surgery  Per pt is breathing at his baseline. O2 sat 100 % on [facility] Air in [facility] --------------------------------------------------------------------------------------------------   Diagnostics pending: CBC, BMP, and T&S  Diagnostics Review: BP Readings from Last 3 Encounters:  05/23/24 123/70  05/23/24 132/69  05/23/24 132/69    Wt Readings from Last 3 Encounters:  05/23/24 58.1 kg (128 lb 1.6 oz)  05/23/24 56.7 kg (125 lb)  04/25/24 57.8 kg (127 lb 6.4 oz)     For pt's complete list of medication instructions please review separate AVS note.  -- Addendum for Additional Evaluation, Management, and Time ---  During the course of this preoperative evaluation, additional time and effort was required that extends beyond a routine presurgical assessment.  The assessment and plan that is detailed above which  identifies the extenuating medical or social circumstances that required  further diagnostic evaluation, planning  and active management in order to optimize this patient prior to surgery.   I have personally spent 30 minutes involved in face-to-face and non-face-to-face activities for this patient on the day of the visit.  Professional time spent includes the following activities, in addition to those noted in the documentation: preparing to see the patient by review of history and previous tests, obtaining and reviewing separately obtained history, performing medically appropriate examination and evaluation, counseling and engaging in shared decision making  with the patient and family available, and ordering further tests with independent interpretation       [facility] Activity Status Index: DASI Total Score: 44.7  DASI METs: 8.23   Anesthesia Plan/Consents:  Anesthesia Plan  Plan ASA score: 3   Informed Consent Anesthetic plan and risks discussed with patient. Use of blood products discussed with patient whom consented to blood products.    PAC Attestation: Disposition: I have completed the preoperative examination and reviewed pending studies. At this time, this patient is medically prepared for the planned procedure and can proceed.  Anesthesia was discussed with notations that the final disposition regarding anesthetic management will be addressed by the anesthesiologist on the day of surgery.   I have reviewed the patient's presurgical instructions and preop medication instructions. The patient verbalized understanding of this information. Please refer to the surgeon's clinic notes for documentation of the reason for surgery, details of the surgical pathology, and consents related to the procedure.  Patient instructed to bring photo id the day of surgery, remain NPO after 11PM except for clear liquids 2 hours prior to arrival time, and to call for interval change mandating surgery cancellation.   Electronically signed by: Debby Dallas Cedar, NP, 05/23/2024 1:07 PM   Supervised by Dr. Athena    HISTORY/ROS/OBJECTIVE  Active Problem list: Patient Active Problem List   Diagnosis Date Noted   . Pharyngeal dysphagia 05/23/2024  . Squamous cell cancer of hypopharynx    (CMD) 05/04/2024  . Squamous cell carcinoma of pharynx    (CMD) 04/20/2024    Resolved Problems  No resolved problems to display.   Past Medical History: Medical History[2] Social History:   Social History   Tobacco Use  . Smoking status: Some Days    Types: Cigarettes  . Smokeless tobacco: Never  Substance Use Topics  . Alcohol use: Not Currently     Family History: Family History[3] ROS:  Review of Systems  Constitutional: Negative for chills, fever, weight gain and weight loss.  HENT:  Negative for congestion, nosebleeds and sore throat.   Eyes:  Negative for blurred vision, discharge, double vision and pain.  Cardiovascular:  Negative for chest pain, irregular heartbeat, near-syncope, orthopnea and palpitations.  Respiratory:  Negative for cough, shortness of breath and wheezing.   Endocrine: Negative for cold intolerance and heat intolerance.  Hematologic/Lymphatic: Negative for bleeding problem. Does not bruise/bleed easily.  Skin:  Negative for dry skin, itching and rash.  Musculoskeletal:  Negative for back pain, joint pain and neck pain.  Gastrointestinal:  Negative for abdominal pain, constipation, diarrhea, heartburn, nausea and vomiting.  Genitourinary:  Negative for dysuria and hematuria.  Neurological:  Negative for dizziness, headaches and seizures.   Vitals: Vitals:   05/23/24 1314  BP: 123/70  BP Location: Right arm  Patient Position: Sitting  Pulse: 69  Temp: 97.7 F (36.5 C)  TempSrc: Temporal  SpO2: 100%  Weight: 58.1 kg (128 lb 1.6 oz)  Height: 1.676 m (5' 6)   Physical Exam:  Physical Exam  Airway  Mallampati: III TM Distance (FB): 2 Oral Aperture (FB): 2 Neck ROM: limited ROM Cardiovascular  Rhythm: regular Rate: normal Dental - normal exam Pulmonary - normal exam Abdominal - normal exam HEENT - normal exam Neurological  Neurological: alert and alert and oriented to person, place, time, and situation Functional Status  Exercise Tolerance: >4 METS   Allergies: Allergies[4] Medications: Current Medications[5]  Labs: No results found for: WBC, HGB, HCT, PLT No results found for: NA,  K, CL, CO2, CREATININE, GLU, CALCIUM, PROT, ALBUMIN, BILITOT, AST, ALT No components found for: HBA1C No results found for: INR No results found for: TSH        [1] No past surgical history on file. [2] Past Medical History: Diagnosis Date  . History of head and neck cancer   [3] No family history on file. [4] No Known Allergies [5]  Current Outpatient Medications:  .  acetaminophen (TYLENOL) 325 mg tablet, Take 650 mg by mouth every 6 (six) hours as needed for mild pain (1-3) or moderate pain (4-6)., Disp: , Rfl:  .  oxyCODONE  5 mg capsule, Take 5 mg by mouth every 4 (four) hours as needed for severe pain (7-10)., Disp: , Rfl:

## 2024-05-23 NOTE — Progress Notes (Signed)
 History Gregory Schmitt returns for f/u visit.   He presented fall 2025 with a right neck  mass .  Can be tender, eg with turning the neck.  Overlying skin with redness, and recently purulence began to drain. 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  Also PET with findings in the ?cervical esophagus and ?distal esophagus    We scheduled surgery and he met with Dr Rhea to discuss neoadjuvant therapy but decided not to pursue this.     Has some globus sensation in the midline    He denies sore throat,   dysphagia, otalgia, voice change, hemoptysis.      No  history of face/scalp/neck cutaneous cancers.       PMH - denies  Tobacco - 35 pack-yrs,   cessation was encouraged EtOH - 12 pack on weekends  Lives in [place] ([place] , came from [place] as a child.   currently employed: Biomedical Engineer  . Vitals: Blood pressure 132/69, pulse 78.  . General appearance of patient: healthy and no distress  . Quality of voice: no   hoarseness  . Inspection of head and face: Normocephalic, without obvious abnormality, sinuses nontender to percussion  . Left parotid gland: soft, nontender, no swelling, no masses/lesions . Right parotid gland: soft, nontender, no swelling, no masses/lesions . Facial strength: intact, symmetric  .  Oral cavity: tongue mobile, FOM soft, mucosa healthy throughout with no masses, lesions, ulcerations  . Oropharynx: mucosa of tonsillar fossae healthy; soft palate/uvula intact, mobile, mucosa healthy; tongue base soft, nontender; no   mucosal ulcerations or masses or other worrisome lesions . Mirror Examination: Attempted but difficult due to gag reflex.   . Neck:   palpable lymph nodes   in right neck, tender, hypomobile, overlyihng skin with erythema and an area   of drainage . Thyroid: normal size, nontender, no nodules palpable    Procedure:  Flexible fiberoptic  laryngoscopy Technique:  After anesthetizing the nasal cavity with topical lidocaine  and oxymetazoline, the flexible endoscope was introduced and passed through the nasal cavity into the nasopharynx. The scope was then advanced to the level of the oropharynx.  The posterior soft palate, uvula, tongue base and vallecula were visualized and appeared healthy  without mucosal masses or lesions. There is a small exophytic lesion of the right pyriform , suspicious for the primary tumor.  The epiglottis, aryepiglottic folds,  supraglottis, glottis were visualized and appeared   healthy without mucosal masses or lesions . I do not see any lesions in the esophageal inlet.  Vocal fold mobility was intact and symmetric. The scope was withdrawn from the nose. He tolerated the procedure well.      Procedures   : CPT 31575 Flexible fiberoptic laryngoscopy               Data reviewed  PET/CT SKULL BASE TO MID THIGH, 04/26/2024 10:58 AM   FINDINGS:     FDG avid and necrotic right level 2A lymph node seen on images 44-61 extending along the posterior aspect of the right parotid gland with SUV max up to 15.  A separate subcentimeter right level IIb lymph node on image 49 with SUV max 5.6. Right level IIb/3 lymph node on image 57 with SUV max 5.9.   Prominent FDG uptake is seen in the right superior piriform fossa on image 57 with SUV  max 12.   Focal FDG avid lesion in the distal esophagus seen on image 155 with SUV max 8.5.   OTHER TRACER-RELATED FINDINGS: Expected physiologic activity within the kidneys, ureter, bladder, stomach, bowel, and brain.  There is diffuse and symmetric FDG uptake in the oropharynx and bilateral salivary glands which is presumed inflammatory. Slightly prominent uptake in the left greater than cartilage is favored physiologic.       IMPRESSION: CONCLUSION: 1.  FDG avid right level 2A, 2B/3 cervical lymph nodes are consistent with metastatic disease. 2.  Focal FDG uptake in the  right superior piriform fossa, suspicious for a primary malignancy. Correlation with direct visualization is recommended. 3.  Focal FDG uptake in the distal esophagus, may reflect a synchronous esophageal malignancy. Correlation with endoscopy is strongly recommended. 4.  Inflammatory FDG uptake in bilateral salivary glands.            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  Scope and CT with suspicious area in the right pyriform    PET also with findings in the cervical esophagus and distal esophagus    He would  be best served with surgery + chemotherapy + radiotherapy.    Surgery will include  Endoscopic resection of the pyriform tumor, possibly the cervical esophageal lesion esophagoscopy to look at the distal esophagus (hopefully our rigid instruments will reach) Right radical neck dissection, including resection of overlying skin, SCM, and likely IJV.   Left selective neck dissection   Tissue transfer to cover the carotid      Risks of this procedure include but are not limited to, risks of anesthesia, bleeding, infection;  pain, difficulty swallowing, weight loss,  injury to the teeth, gums, lips, or tongue, numbness, poor cosmetic appearance, weakness of nerves to the shoulder, lip, tongue, or larynx, chyle leak , possible need for skin graft, seroma,  risk of the anastomosis including thrombosis or flap failure ; risks of any tumor surgery, such as recurrence, need for further procedures or other treatments   We will schedule this for the near future. .   Dr Murry sees him later today to discuss reconstruction options.    Best contact:  [phone] wife Sommay       He is agreeable with this plan.  All his questions were answered.       cc:  MARLA Tobie VEAR Murry   The provider time of the today's visit was 30 minutes. This included time spent face-to-face obtaining history and performing  examination, review of office notes from other providers, labs, imaging since our last visit, counseling and educating the patient/family/caregiver, ordering tests/treatments/procedures, communicating with other providers, care coordination, and/or documentation of the visit.   30-39 minutes: [id number]
